# Patient Record
Sex: Female | Born: 1935 | Race: White | Hispanic: No
Health system: Southern US, Community
[De-identification: ages and names within clinical notes are randomized; demographics above are authoritative.]

---

## 2008-04-10 ENCOUNTER — Ambulatory Visit: Payer: Self-pay | Admitting: Vascular Surgery

## 2008-05-16 ENCOUNTER — Ambulatory Visit: Payer: Self-pay | Admitting: Vascular Surgery

## 2009-01-10 ENCOUNTER — Inpatient Hospital Stay (HOSPITAL_COMMUNITY): Admission: EM | Admit: 2009-01-10 | Discharge: 2009-01-16 | Payer: Self-pay | Admitting: Emergency Medicine

## 2009-01-10 ENCOUNTER — Ambulatory Visit: Payer: Self-pay | Admitting: Infectious Diseases

## 2009-01-24 ENCOUNTER — Inpatient Hospital Stay (HOSPITAL_COMMUNITY): Admission: EM | Admit: 2009-01-24 | Discharge: 2009-01-26 | Payer: Self-pay | Admitting: Emergency Medicine

## 2009-01-24 ENCOUNTER — Ambulatory Visit: Payer: Self-pay | Admitting: Critical Care Medicine

## 2009-01-26 ENCOUNTER — Encounter: Payer: Self-pay | Admitting: Internal Medicine

## 2009-02-14 DEATH — deceased

## 2009-10-18 ENCOUNTER — Encounter: Payer: Self-pay | Admitting: Internal Medicine

## 2010-04-16 NOTE — Letter (Signed)
Summary: [facility]  [facility]   Imported By: Lester [place] 10/24/2009 07:19:09  _____________________________________________________________________  External Attachment:    Type:   Image     Comment:   External Document

## 2010-06-19 LAB — BASIC METABOLIC PANEL
BUN: 45 mg/dL — ABNORMAL HIGH (ref 6–23)
BUN: 72 mg/dL — ABNORMAL HIGH (ref 6–23)
CO2: 20 mEq/L (ref 19–32)
Calcium: 7.6 mg/dL — ABNORMAL LOW (ref 8.4–10.5)
Calcium: 8.6 mg/dL (ref 8.4–10.5)
GFR calc non Af Amer: 5 mL/min — ABNORMAL LOW (ref >60)
GFR calc non Af Amer: 5 mL/min — ABNORMAL LOW (ref >60)
Glucose, Bld: 101 mg/dL — ABNORMAL HIGH (ref 70–99)
Glucose, Bld: 139 mg/dL — ABNORMAL HIGH (ref 70–99)
Glucose, Bld: 84 mg/dL (ref 70–99)
Potassium: 6 mEq/L — ABNORMAL HIGH (ref 3.5–5.1)
Potassium: 6.3 mEq/L (ref 3.5–5.1)
Sodium: 140 mEq/L (ref 135–145)

## 2010-06-19 LAB — RENAL FUNCTION PANEL
Albumin: 1.7 g/dL — ABNORMAL LOW (ref 3.5–5.2)
Albumin: 1.8 g/dL — ABNORMAL LOW (ref 3.5–5.2)
Albumin: 2.1 g/dL — ABNORMAL LOW (ref 3.5–5.2)
BUN: 27 mg/dL — ABNORMAL HIGH (ref 6–23)
BUN: 72 mg/dL — ABNORMAL HIGH (ref 6–23)
CO2: 20 mEq/L (ref 19–32)
Calcium: 7.5 mg/dL — ABNORMAL LOW (ref 8.4–10.5)
Calcium: 7.7 mg/dL — ABNORMAL LOW (ref 8.4–10.5)
Calcium: 7.9 mg/dL — ABNORMAL LOW (ref 8.4–10.5)
Creatinine, Ser: 6.65 mg/dL — ABNORMAL HIGH (ref 0.4–1.2)
Creatinine, Ser: 8.14 mg/dL — ABNORMAL HIGH (ref 0.4–1.2)
Creatinine, Ser: 8.16 mg/dL — ABNORMAL HIGH (ref 0.4–1.2)
GFR calc Af Amer: 6 mL/min — ABNORMAL LOW (ref >60)
GFR calc Af Amer: 6 mL/min — ABNORMAL LOW (ref >60)
GFR calc non Af Amer: 5 mL/min — ABNORMAL LOW (ref >60)
GFR calc non Af Amer: 5 mL/min — ABNORMAL LOW (ref >60)
GFR calc non Af Amer: 6 mL/min — ABNORMAL LOW (ref >60)
Glucose, Bld: 89 mg/dL (ref 70–99)
Potassium: 3.4 mEq/L — ABNORMAL LOW (ref 3.5–5.1)

## 2010-06-19 LAB — DIFFERENTIAL
Basophils Absolute: 0 10*3/uL (ref 0.0–0.1)
Eosinophils Absolute: 0 10*3/uL (ref 0.0–0.7)
Eosinophils Relative: 0 % (ref 0–5)
Lymphocytes Relative: 10 % — ABNORMAL LOW (ref 12–46)
Lymphocytes Relative: 2 % — ABNORMAL LOW (ref 12–46)
Monocytes Absolute: 0.5 10*3/uL (ref 0.1–1.0)
Monocytes Relative: 3 % (ref 3–12)
Neutro Abs: 26.3 10*3/uL — ABNORMAL HIGH (ref 1.7–7.7)
Neutro Abs: 5.9 10*3/uL (ref 1.7–7.7)
Neutrophils Relative %: 88 % — ABNORMAL HIGH (ref 43–77)

## 2010-06-19 LAB — COMPREHENSIVE METABOLIC PANEL
ALT: 14 U/L (ref 0–35)
Albumin: 2.2 g/dL — ABNORMAL LOW (ref 3.5–5.2)
Alkaline Phosphatase: 72 U/L (ref 39–117)
CO2: 22 mEq/L (ref 19–32)
GFR calc Af Amer: 7 mL/min — ABNORMAL LOW (ref >60)

## 2010-06-19 LAB — CBC
HCT: 33.9 % — ABNORMAL LOW (ref 36.0–46.0)
HCT: 34.2 % — ABNORMAL LOW (ref 36.0–46.0)
Hemoglobin: 10.9 g/dL — ABNORMAL LOW (ref 12.0–15.0)
Hemoglobin: 12.3 g/dL (ref 12.0–15.0)
MCHC: 31.8 g/dL (ref 30.0–36.0)
MCHC: 31.8 g/dL (ref 30.0–36.0)
MCV: 97.1 fL (ref 78.0–100.0)
MCV: 97.8 fL (ref 78.0–100.0)
Platelets: 127 10*3/uL — ABNORMAL LOW (ref 150–400)
Platelets: 176 10*3/uL (ref 150–400)
RBC: 3.9 MIL/uL (ref 3.87–5.11)
RDW: 18.9 % — ABNORMAL HIGH (ref 11.5–15.5)
RDW: 19.1 % — ABNORMAL HIGH (ref 11.5–15.5)
RDW: 19.4 % — ABNORMAL HIGH (ref 11.5–15.5)

## 2010-06-19 LAB — POCT I-STAT, CHEM 8
Calcium, Ion: 1.2 mmol/L (ref 1.12–1.32)
Chloride: 109 mEq/L (ref 96–112)
HCT: 40 % (ref 36.0–46.0)
Potassium: 6 mEq/L — ABNORMAL HIGH (ref 3.5–5.1)
Sodium: 141 mEq/L (ref 135–145)

## 2010-06-19 LAB — GLUCOSE, CAPILLARY
Glucose-Capillary: 116 mg/dL — ABNORMAL HIGH (ref 70–99)
Glucose-Capillary: 133 mg/dL — ABNORMAL HIGH (ref 70–99)
Glucose-Capillary: 133 mg/dL — ABNORMAL HIGH (ref 70–99)
Glucose-Capillary: 135 mg/dL — ABNORMAL HIGH (ref 70–99)
Glucose-Capillary: 140 mg/dL — ABNORMAL HIGH (ref 70–99)
Glucose-Capillary: 74 mg/dL (ref 70–99)
Glucose-Capillary: 89 mg/dL (ref 70–99)
Glucose-Capillary: 94 mg/dL (ref 70–99)
Glucose-Capillary: 96 mg/dL (ref 70–99)

## 2010-06-19 LAB — CULTURE, BLOOD (ROUTINE X 2)

## 2010-06-19 LAB — POCT CARDIAC MARKERS
Myoglobin, poc: 203 ng/mL (ref 12–200)
Troponin i, poc: <0.05 ng/mL (ref 0.00–0.09)

## 2010-06-19 LAB — PHOSPHORUS: Phosphorus: 9.5 mg/dL — ABNORMAL HIGH (ref 2.3–4.6)

## 2010-06-19 LAB — BRAIN NATRIURETIC PEPTIDE: Pro B Natriuretic peptide (BNP): 723 pg/mL — ABNORMAL HIGH (ref 0.0–100.0)

## 2010-06-19 LAB — LIPASE, BLOOD: Lipase: 12 U/L (ref 11–59)

## 2010-06-20 LAB — CBC
HCT: 36.1 % (ref 36.0–46.0)
HCT: 37.8 % (ref 36.0–46.0)
Hemoglobin: 11.3 g/dL — ABNORMAL LOW (ref 12.0–15.0)
Hemoglobin: 12 g/dL (ref 12.0–15.0)
MCHC: 31.6 g/dL (ref 30.0–36.0)
MCV: 97.3 fL (ref 78.0–100.0)
Platelets: 164 10*3/uL (ref 150–400)
Platelets: 166 10*3/uL (ref 150–400)
RBC: 3.71 MIL/uL — ABNORMAL LOW (ref 3.87–5.11)
RDW: 19.6 % — ABNORMAL HIGH (ref 11.5–15.5)
WBC: 7.6 10*3/uL (ref 4.0–10.5)
WBC: 8.4 10*3/uL (ref 4.0–10.5)

## 2010-06-20 LAB — BASIC METABOLIC PANEL
BUN: 23 mg/dL (ref 6–23)
BUN: 38 mg/dL — ABNORMAL HIGH (ref 6–23)
CO2: 28 mEq/L (ref 19–32)
Creatinine, Ser: 5.61 mg/dL — ABNORMAL HIGH (ref 0.4–1.2)
GFR calc non Af Amer: 6 mL/min — ABNORMAL LOW (ref >60)
GFR calc non Af Amer: 7 mL/min — ABNORMAL LOW (ref >60)
Glucose, Bld: 88 mg/dL (ref 70–99)
Potassium: 3.2 mEq/L — ABNORMAL LOW (ref 3.5–5.1)

## 2010-06-20 LAB — CULTURE, BLOOD (ROUTINE X 2)
Culture: NO GROWTH
Culture: NO GROWTH

## 2010-06-20 LAB — DIFFERENTIAL
Basophils Absolute: 0 10*3/uL (ref 0.0–0.1)
Basophils Relative: 0 % (ref 0–1)
Eosinophils Absolute: 0.1 10*3/uL (ref 0.0–0.7)
Eosinophils Relative: 1 % (ref 0–5)
Lymphocytes Relative: 10 % — ABNORMAL LOW (ref 12–46)
Monocytes Absolute: 0.4 10*3/uL (ref 0.1–1.0)

## 2010-06-20 LAB — ALBUMIN: Albumin: 2 g/dL — ABNORMAL LOW (ref 3.5–5.2)

## 2010-06-20 LAB — RENAL FUNCTION PANEL
CO2: 26 mEq/L (ref 19–32)
CO2: 28 mEq/L (ref 19–32)
Calcium: 7.5 mg/dL — ABNORMAL LOW (ref 8.4–10.5)
Chloride: 100 mEq/L (ref 96–112)
Chloride: 105 mEq/L (ref 96–112)
Creatinine, Ser: 6.43 mg/dL — ABNORMAL HIGH (ref 0.4–1.2)
GFR calc Af Amer: 14 mL/min — ABNORMAL LOW (ref >60)
GFR calc non Af Amer: 11 mL/min — ABNORMAL LOW (ref >60)
GFR calc non Af Amer: 6 mL/min — ABNORMAL LOW (ref >60)
Glucose, Bld: 66 mg/dL — ABNORMAL LOW (ref 70–99)
Glucose, Bld: 97 mg/dL (ref 70–99)
Sodium: 142 mEq/L (ref 135–145)

## 2010-06-20 LAB — LIPID PANEL: Cholesterol: 104 mg/dL (ref 0–200)

## 2010-06-20 LAB — GLUCOSE, CAPILLARY
Glucose-Capillary: 103 mg/dL — ABNORMAL HIGH (ref 70–99)
Glucose-Capillary: 103 mg/dL — ABNORMAL HIGH (ref 70–99)
Glucose-Capillary: 105 mg/dL — ABNORMAL HIGH (ref 70–99)
Glucose-Capillary: 116 mg/dL — ABNORMAL HIGH (ref 70–99)
Glucose-Capillary: 121 mg/dL — ABNORMAL HIGH (ref 70–99)
Glucose-Capillary: 158 mg/dL — ABNORMAL HIGH (ref 70–99)
Glucose-Capillary: 66 mg/dL — ABNORMAL LOW (ref 70–99)
Glucose-Capillary: 79 mg/dL (ref 70–99)
Glucose-Capillary: 86 mg/dL (ref 70–99)
Glucose-Capillary: 95 mg/dL (ref 70–99)
Glucose-Capillary: 97 mg/dL (ref 70–99)

## 2010-06-20 LAB — HEPATIC FUNCTION PANEL
Alkaline Phosphatase: 83 U/L (ref 39–117)
Bilirubin, Direct: 0.2 mg/dL (ref 0.0–0.3)
Indirect Bilirubin: 0.7 mg/dL (ref 0.3–0.9)
Total Bilirubin: 0.9 mg/dL (ref 0.3–1.2)

## 2010-06-20 LAB — BLOOD GAS, ARTERIAL
Bicarbonate: 27.9 mEq/L — ABNORMAL HIGH (ref 20.0–24.0)
O2 Saturation: 90.4 %
Patient temperature: 98.6
TCO2: 28.9 mmol/L (ref 0–100)
pH, Arterial: 7.507 — ABNORMAL HIGH (ref 7.350–7.400)
pO2, Arterial: 63.1 mmHg — ABNORMAL LOW (ref 80.0–100.0)

## 2010-06-20 LAB — HEMOGLOBIN A1C
Hgb A1c MFr Bld: 4.6 % (ref 4.6–6.1)
Mean Plasma Glucose: 85 mg/dL

## 2010-06-20 LAB — CLOSTRIDIUM DIFFICILE EIA: C difficile Toxins A+B, EIA: NEGATIVE

## 2010-06-20 LAB — BRAIN NATRIURETIC PEPTIDE: Pro B Natriuretic peptide (BNP): 3128 pg/mL — ABNORMAL HIGH (ref 0.0–100.0)

## 2010-06-20 LAB — IRON AND TIBC: Saturation Ratios: 42 % (ref 20–55)

## 2010-07-30 NOTE — Consult Note (Signed)
NEW PATIENT CONSULTATION   Belinda Hernandez, Belinda Hernandez  DOB:  [birth year]                                       04/10/2008  [id number]#:[id number]   The patient is an 77 year old female with diabetes mellitus and end-  stage renal disease on hemodialysis for 4 years.  Has been having  discomfort in both legs for the last several months.  She developed a  small sore on her right heel in November 2009 which has improved and is  about completely healed.  The daughter relates there was also a small  sore between two toes which has healed.  She complains of discomfort in  both legs from the knees to the feet, sometimes burning in nature.  She  has had no ulcerations in the left leg.  She is able to ambulate with a  walker.   PAST MEDICAL HISTORY:  1. Non-insulin diabetes mellitus.  2. Hypertension.  3. End-stage renal disease.  4. 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  5. Negative for coronary artery disease or stroke.   PAST SURGICAL HISTORY:  1. Cholecystectomy.  2. Left upper arm AV fistula.   FAMILY HISTORY:  Positive for stroke in her mother.  Negative for  diabetes, coronary artery disease.   SOCIAL HISTORY:  She is widowed, has three children, is retired.  She  does not use tobacco or alcohol.   REVIEW OF SYSTEMS:  Primarily positive for dyspnea on exertion, home  oxygen no chronic hemodialysis.   ALLERGIES:  Penicillin.   PHYSICAL EXAMINATION:  Vital signs:  Blood pressure 189/70, heart rate  72, respirations 20.  General:  She is an elderly female in no apparent  stress, alert and oriented x3.  Neck:  Supple, 3+ carotid pulses  palpable.  No bruits are audible.  Neurologic:  Normal.  There is  decreased sensation in both lower extremities.  Abdomen:  Soft,  nontender with no masses.  Chest:  Clear to auscultation.  Cardiovascular:  Regular rhythm, no murmurs.  Extremity Exam:  Reveals  3+ femoral pulses bilaterally with  absent popliteal and distal pulses.  Both feet are well-perfused.  There is a small ulceration measuring 2-3-  mm in diameter on the dependent aspect of the right heel which is almost  completely healed, no evidence of infection, erythema or tenderness.  No  other ulcers are noted on either lower extremity.  No calf tenderness  noted.   Lower extremity arterial Dopplers revealed evidence of superficial  femoral and tibial disease with ABI of 0.55 on the right, 0.67 on the  left.   She had a CT angiogram performed in [place] on April 03, 2008.  Results I do have on a written report which reveal diffuse superficial  femoral disease bilaterally as well as tibial disease.  She does have a  patent popliteal artery below the knee on the right and would be a  candidate for femoral popliteal bypass grafting if necessary.   I discussed with the son and daughter the fact that the patient does not  need any reconstruction or revascularization at this time.  Try to avoid  pressure on the heel and to observe closely to make sure it does not  become infected.  I do not think bypass would relieve her symptoms of  leg pain in either leg.  I will defer to her medical doctor about  possibly trying Neurontin for her neuropathy.  She will return to see [place]  on a p.r.n. basis.   Quita Skye Hart Rochester, M.D.  Electronically Signed   JDL/MEDQ  D:  04/10/2008  T:  04/11/2008  Job:  [id number]   cc:   Dr. Westley Chandler, Dr.

## 2010-07-30 NOTE — Assessment & Plan Note (Signed)
OFFICE VISIT   Belinda Hernandez, Belinda Hernandez  DOB:  May 20, 1919                                       05/16/2008  [id number]#:[id number]   The patient and her daughter return today with further testing of her  lower extremity occlusive disease.  I evaluated her 04/10/2008.  She had  a CT angiogram performed in [place] and is technically a candidate  for right femoral-popliteal bypass graft.  She has had a very small  ulcer on the right heel which has been stable.  She has had no evidence  of gangrene or infection in the right leg and the ulcer continues to  measure 2-3 mm in diameter on the right heel.  She has no ischemic  symptoms in the left leg.  She continues to have weakness in both legs  with walking and had some instability in her left leg recently and the  family is concerned about this.  ABIs had been previously checked last  month and were 0.55 on the right and 0.67 on the left.   On exam she continues to have a femoral pulse bilaterally at 2-3+, no  popliteal or distal pulses.  The ulcer is unchanged on the right heel  with no evidence of infection, ulceration or cellulitis.  I answered  their questions and had a long discussion with them about the need for  bypass which I do not recommend at this time unless things worsen.  If  her symptoms become more severe or the ulcer enlarges or if she develops  any gangrenous areas or infection or other ulcers she will be in touch  with me.  Otherwise we will see her on p.r.n. basis.  Blood pressure  today is 115/56, heart rate 79, respirations 14.   Quita Skye. Hart [place], M.D.  Electronically Signed   JDL/MEDQ  D:  05/16/2008  T:  05/17/2008  Job:  [id number]

## 2010-11-04 IMAGING — CR DG CHEST 1V PORT
1 series · 1 of 1 positions shown · non-contrast
Comparison: 01/10/2009

CLINICAL DATA: Pneumonia

PORTABLE CHEST - 1 VIEW

[view not recorded]
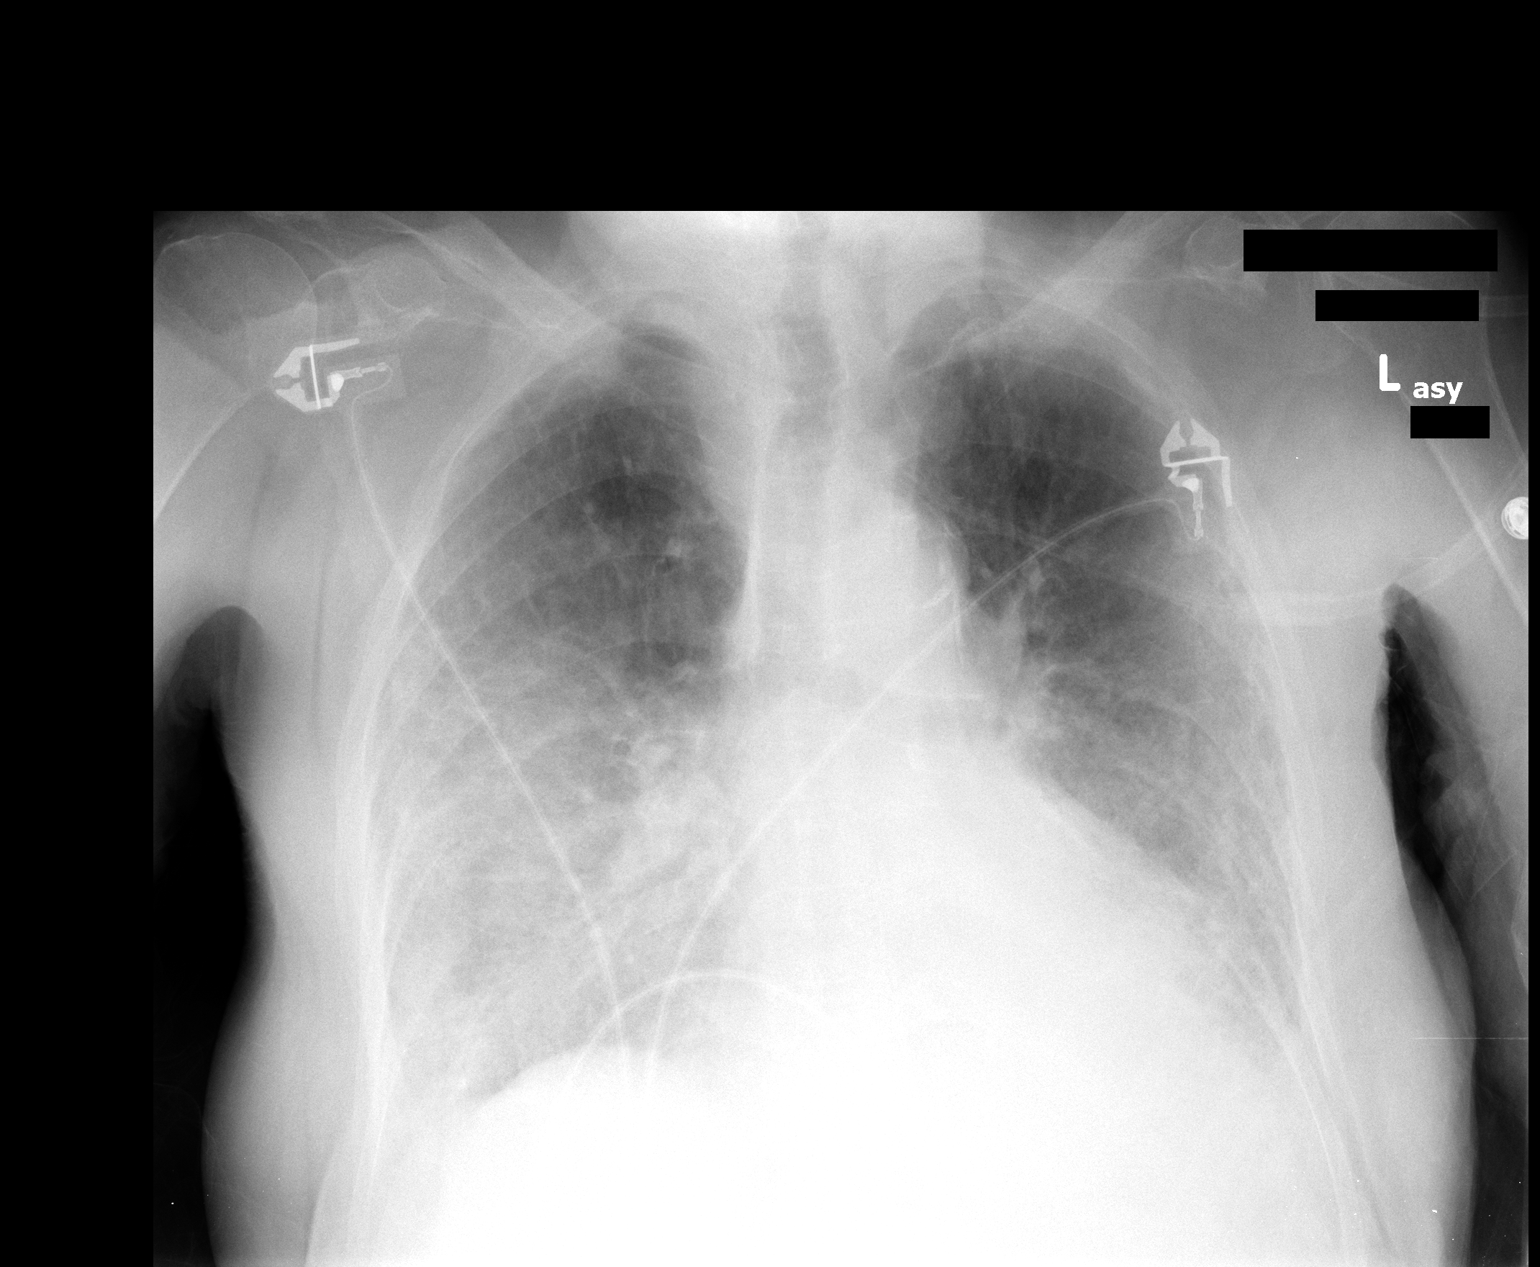

[1 of 1 positions shown; findings below may reference images not displayed]

FINDINGS: Stable diffuse airspace disease.  Stable cardiomegaly.
Small bilateral effusions are less apparent in the supine position.
No pneumothorax.
IMPRESSION: Stable.

## 2010-11-17 IMAGING — CR DG CHEST 2V
1 series · 1 of 1 positions shown · non-contrast
Comparison: 01/12/2009

CLINICAL DATA: Fever and vomiting.

CHEST - 2 VIEW

[w chest lat]
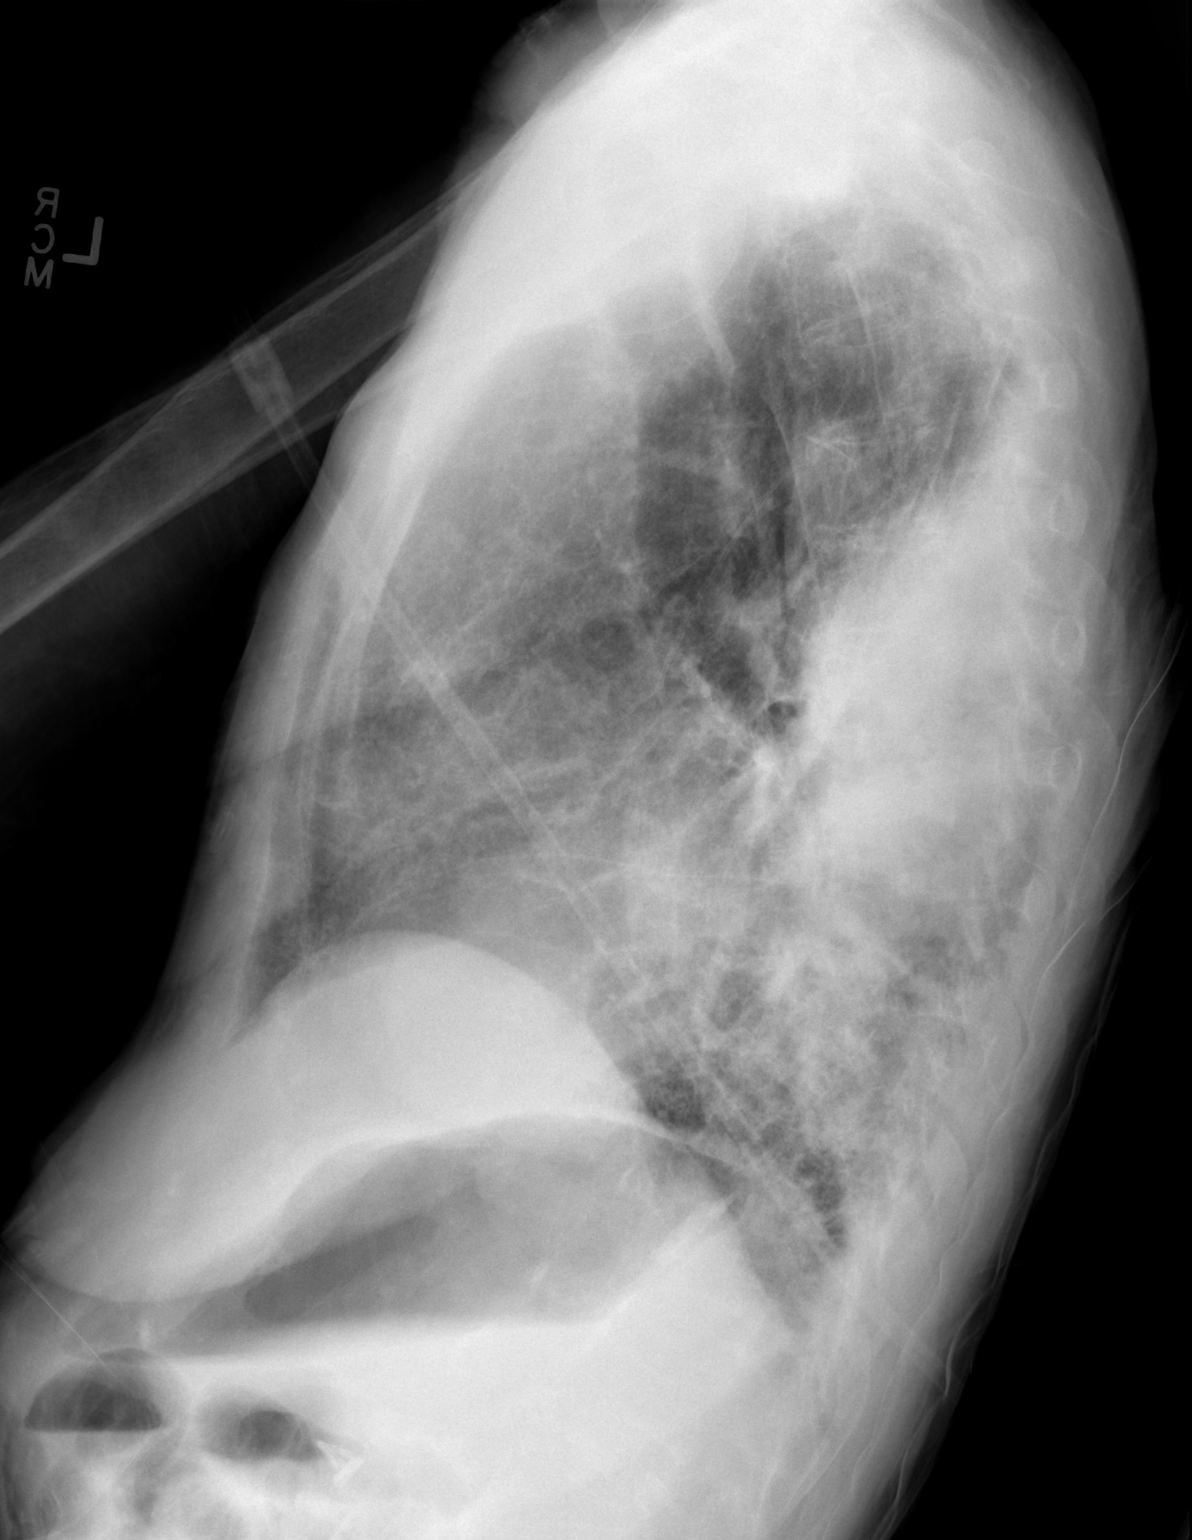

[1 of 1 positions shown; findings below may reference images not displayed]

FINDINGS: The heart is mildly enlarged but stable.  There is
tortuosity and calcification of the thoracic aorta.  There are
chronic underlying lung changes with areas of pulmonary fibrosis.
There is a new dense superior segment right lower lobe pneumonia.
No pleural effusion.  No pneumothorax.  The bony thorax is stable.
IMPRESSION: New dense superior segment right lower lobe pneumonia.

## 2010-11-17 IMAGING — CT CT HEAD W/O CM
1 series · 16 of 30 positions shown, 20 images · non-contrast
Comparison: None

CLINICAL DATA: Decreased level of consciousness.

CT HEAD WITHOUT CONTRAST
TECHNIQUE: Contiguous axial images were obtained from the base of
the skull through the vertex without contrast.

[Series 2: brain · axial · 0.47mm/px · z∈[+138,+289]mm · 16 of 32 slices shown, 20 images]
[im 2/32  brain]
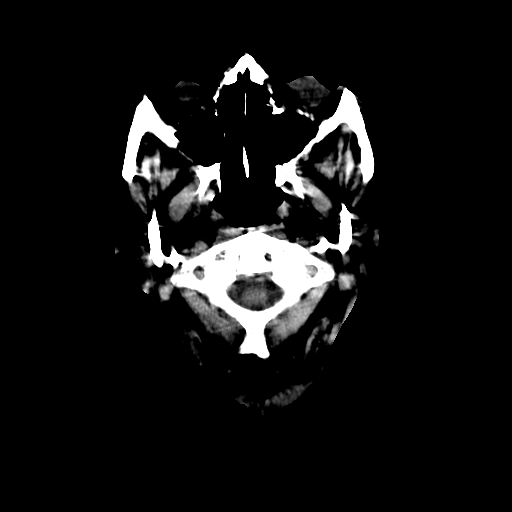
[im 2/32  bone]
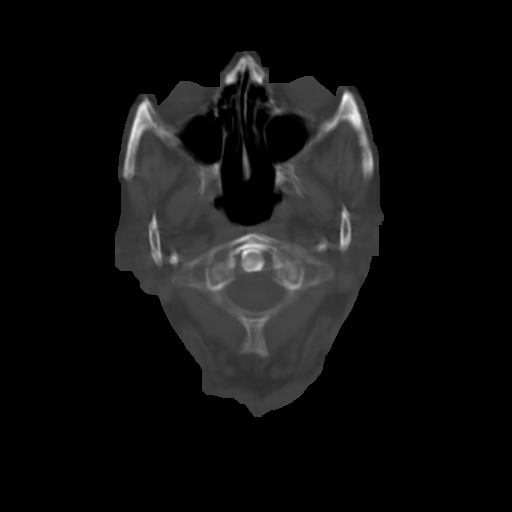
[im 4/32  brain]
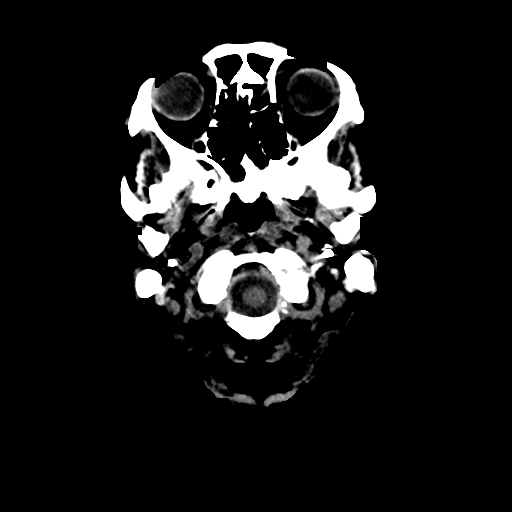
[im 6/32  brain]
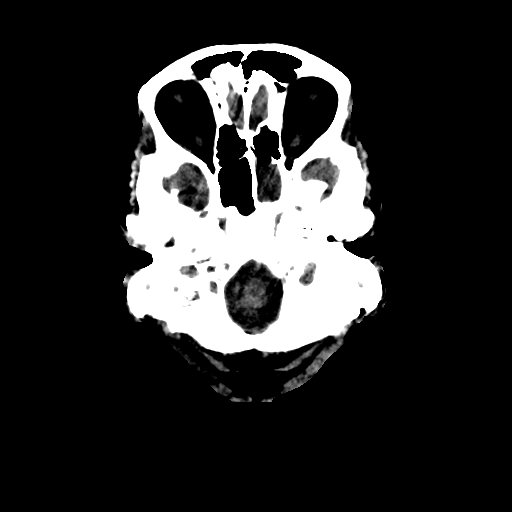
[im 8/32  brain]
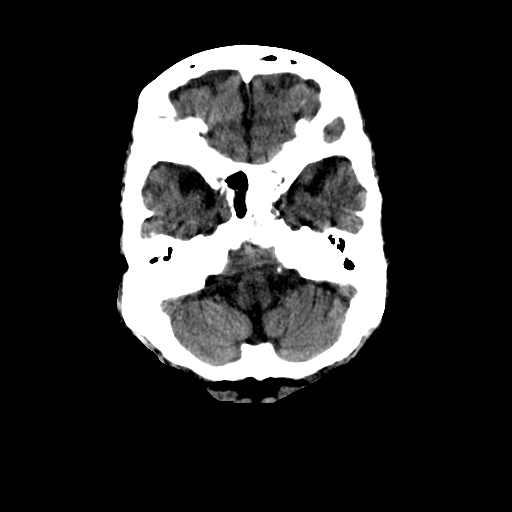
[im 9/32  brain]
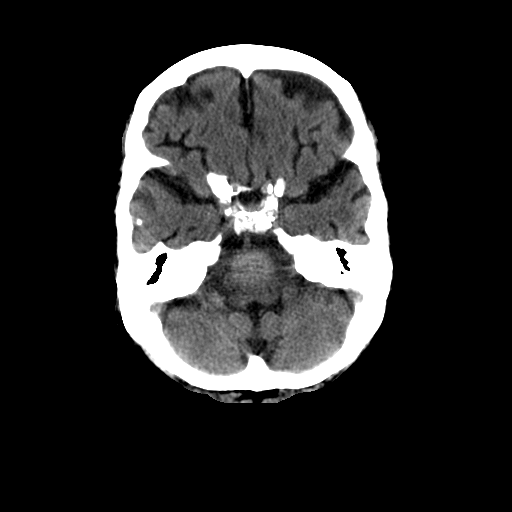
[im 9/32  bone]
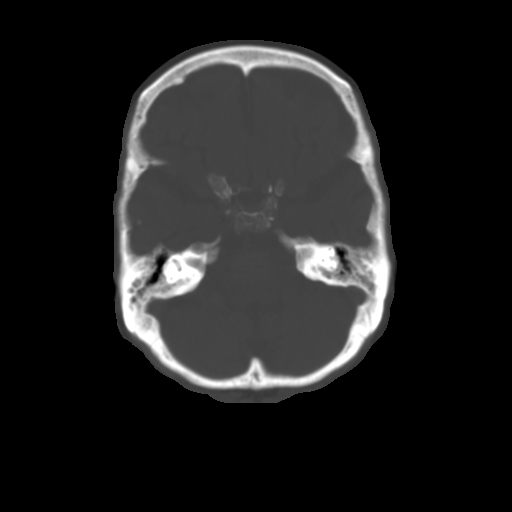
[im 11/32  brain]
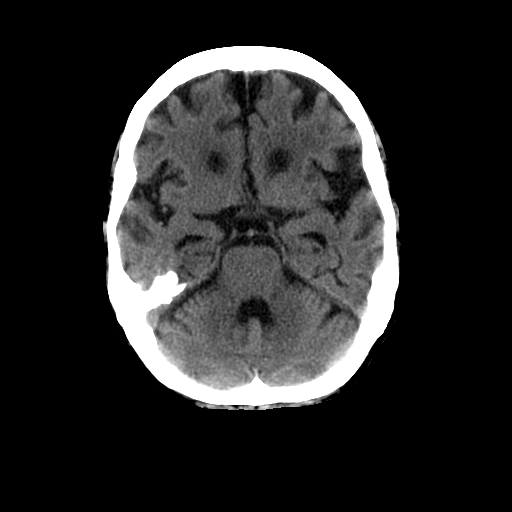
[im 13/32  brain]
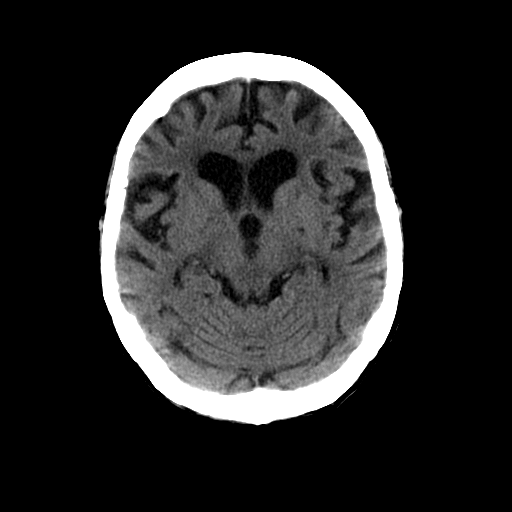
[im 15/32  brain]
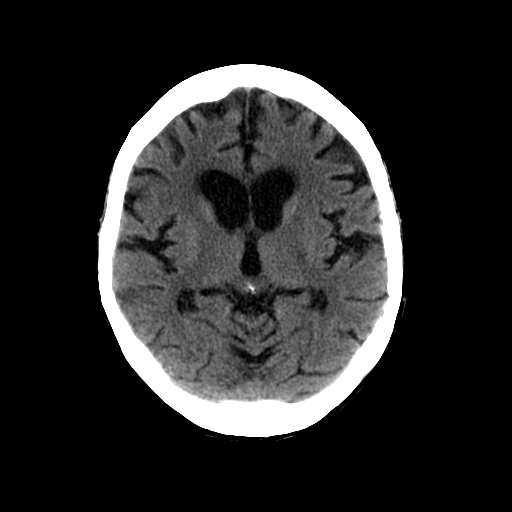
[im 17/32  brain]
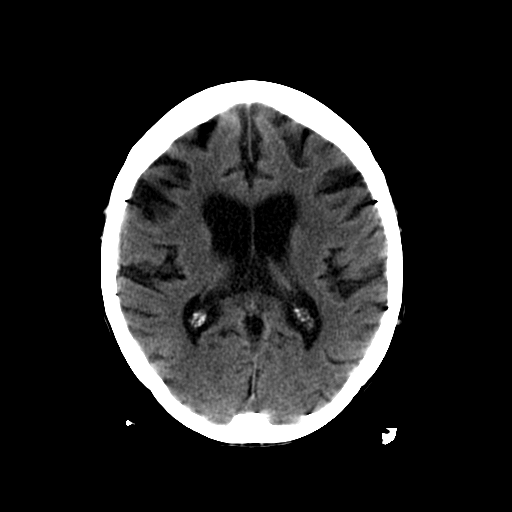
[im 17/32  bone]
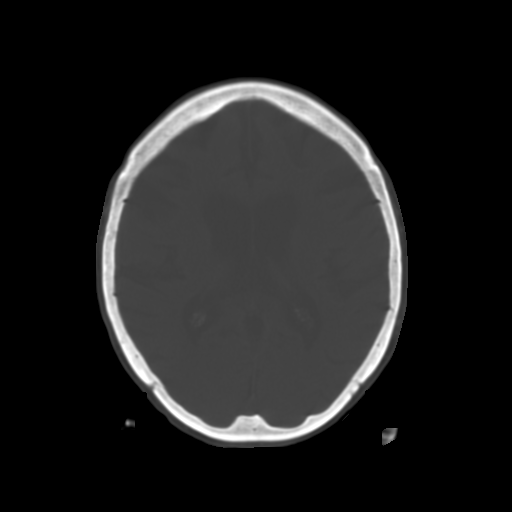
[im 19/32  brain]
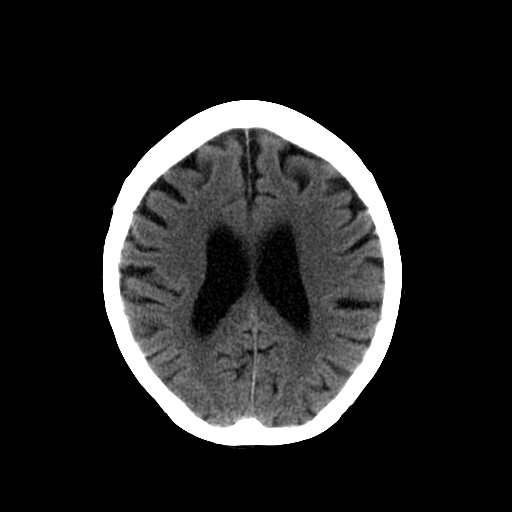
[im 21/32  brain]
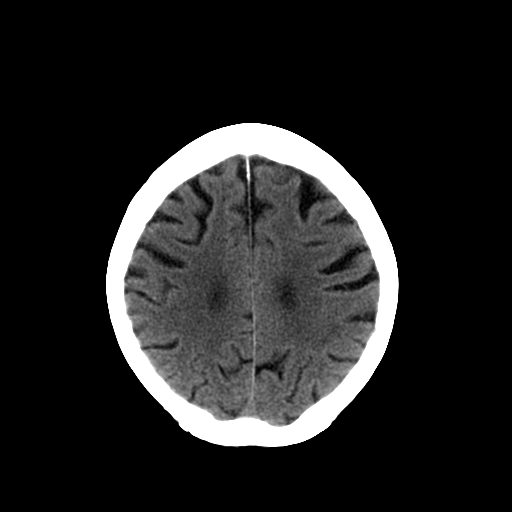
[im 23/32  brain]
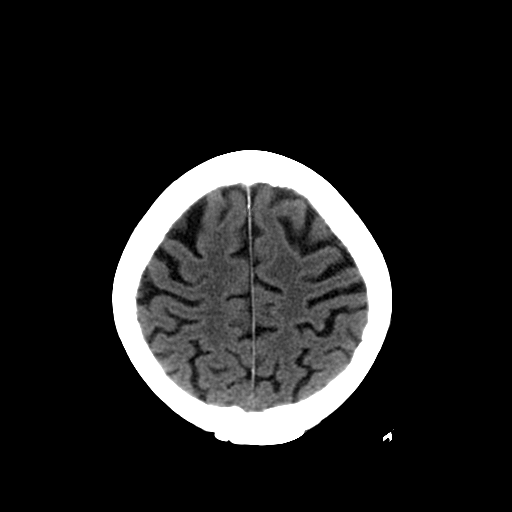
[im 24/32  brain]
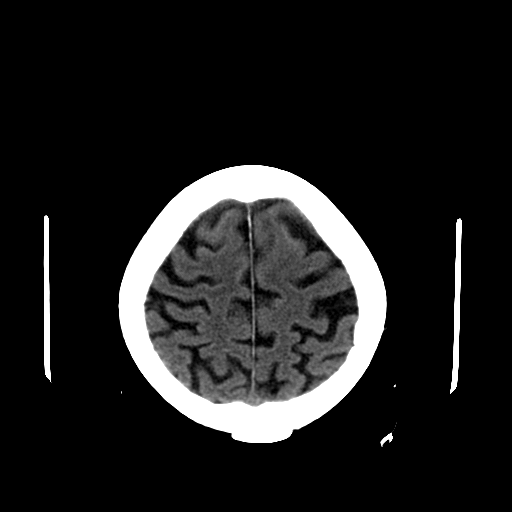
[im 24/32  bone]
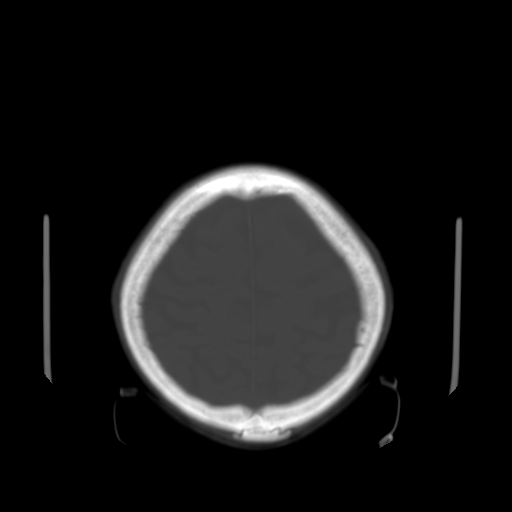
[im 26/32  brain]
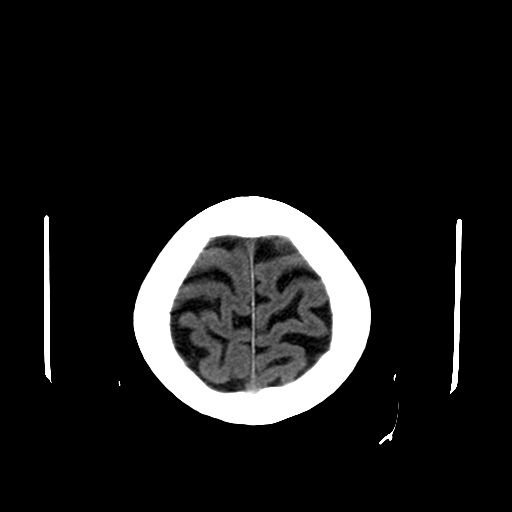
[im 28/32  brain]
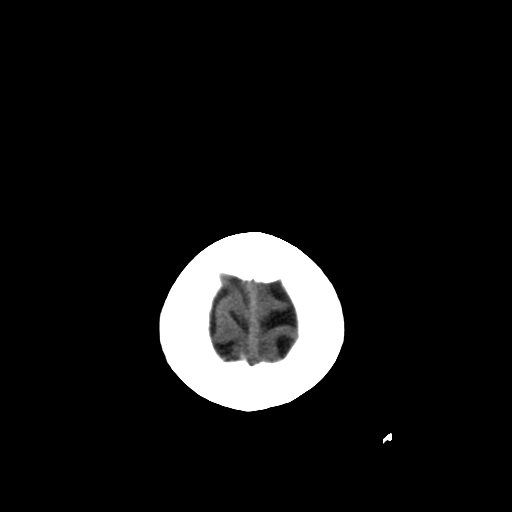
[im 30/32  brain]
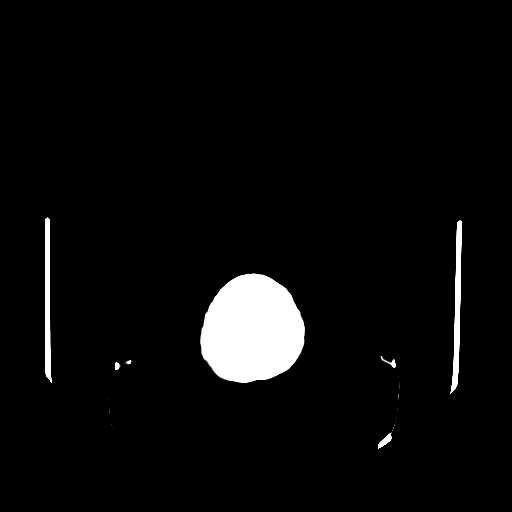

[16 of 30 positions shown; findings below may reference images not displayed]

FINDINGS: There is age related cerebral atrophy, ventriculomegaly
and periventricular white matter disease.  No acute intracranial
findings or mass lesions.  The brainstem and cerebellum are grossly
normal.  The bony calvarium is intact.  Chronic-appearing bilateral
mastoid disease and left sphenoid sinus disease.
IMPRESSION: 1.  Age related cerebral atrophy, ventriculomegaly and
periventricular white matter disease.
2.  No acute intracranial findings or mass lesions.
3.  Chronic-appearing mastoid disease and left sphenoid disease.

## 2010-11-18 IMAGING — CR DG CHEST 1V PORT
1 series · 1 of 1 positions shown · non-contrast
Comparison: 01/24/2009

CLINICAL DATA: Right toward stress.

PORTABLE CHEST - 1 VIEW

[view not recorded]
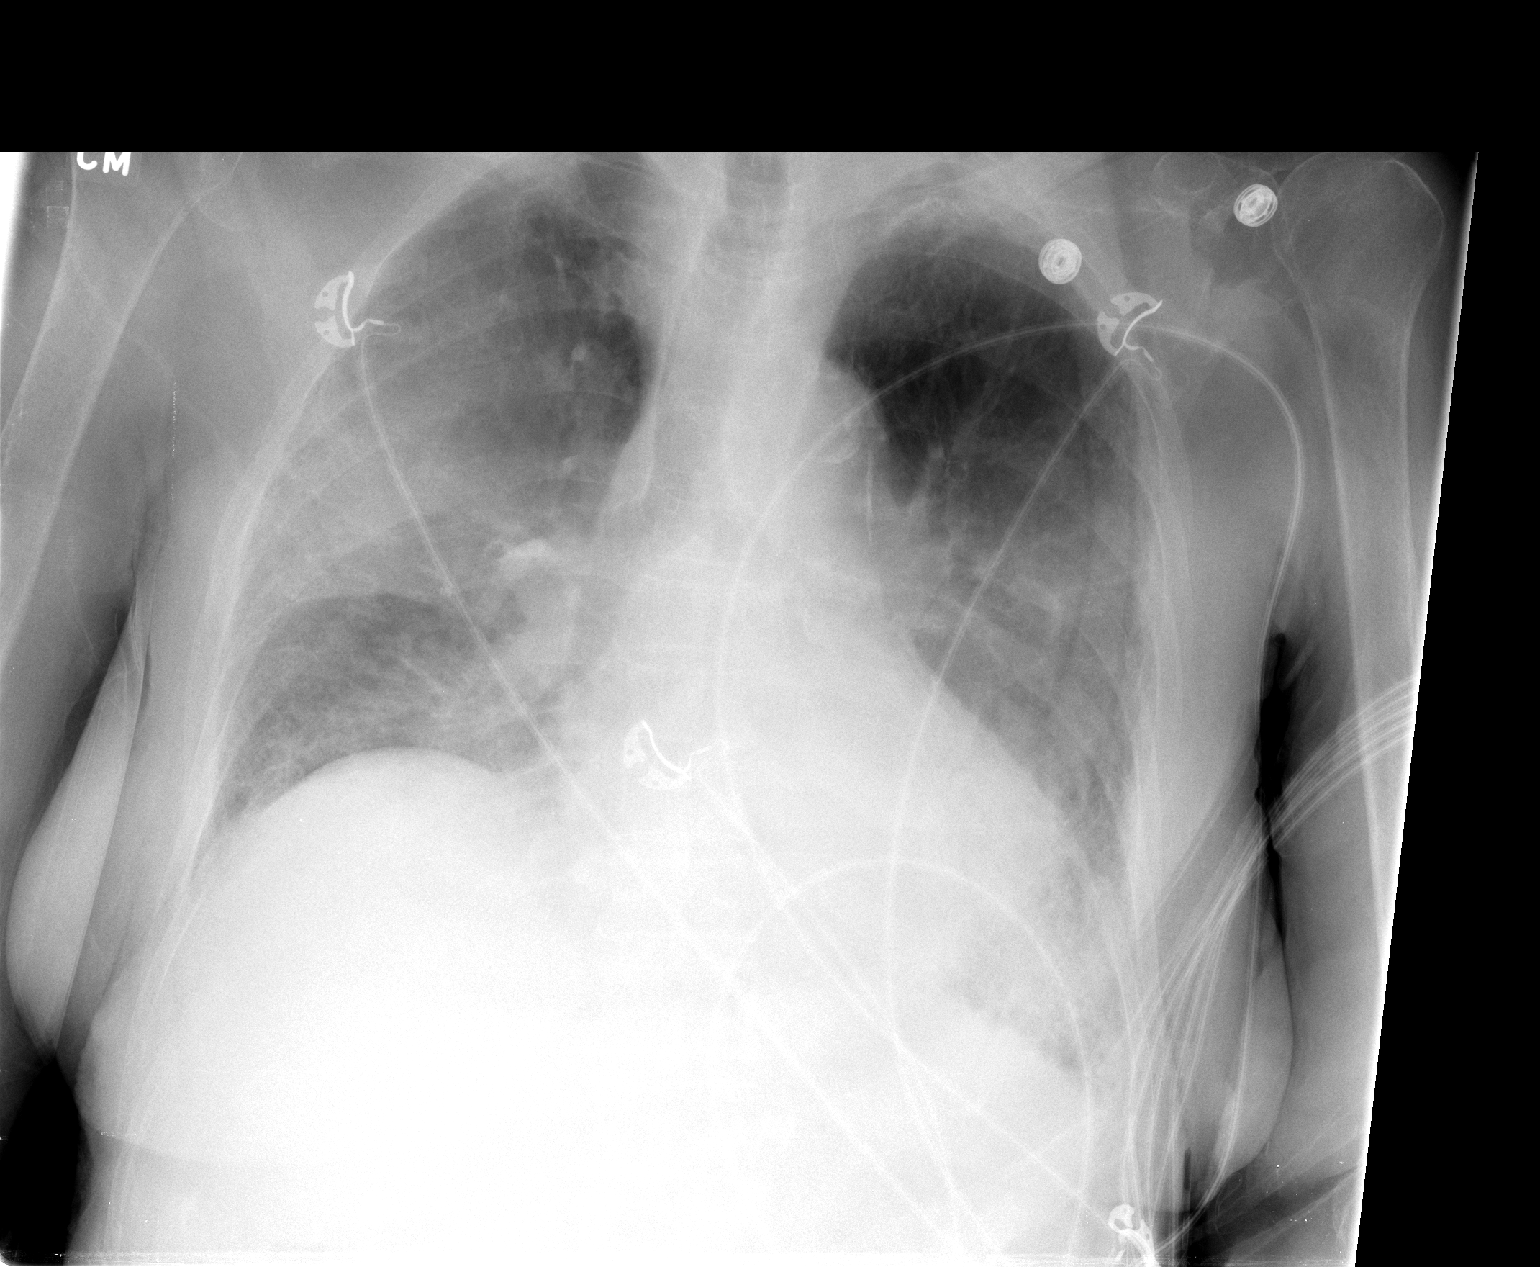

[1 of 1 positions shown; findings below may reference images not displayed]

FINDINGS: Again appreciated is airspace disease in the right upper
lobe and more subtle degree of airspace disease in the left midlung
zone.
IMPRESSION: Bilateral airspace disease consistent with pneumonia.  Right upper
lobe airspace disease may be slightly less dense now.

## 2019-10-07 ENCOUNTER — Telehealth: Payer: Self-pay | Admitting: *Deleted

## 2019-10-07 NOTE — Telephone Encounter (Signed)
Opened in error
